# Patient Record
Sex: Male | Born: 2000 | Race: White | Hispanic: No | Marital: Single | Smoking: Never smoker
Health system: Southern US, Community
[De-identification: ages and names within clinical notes are randomized; demographics above are authoritative.]

## PROBLEM LIST (undated history)

## (undated) HISTORY — PX: NO PAST SURGERIES: SHX2092

---
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
## 2022-08-23 ENCOUNTER — Ambulatory Visit
Admission: RE | Admit: 2022-08-23 | Discharge: 2022-08-23 | Disposition: A | Discharge status: Home / Self Care | Payer: BC Managed Care – PPO | Source: Ambulatory Visit | Attending: Emergency Medicine | Admitting: Emergency Medicine

## 2022-08-23 VITALS — BP 120/74 | HR 70 | Temp 98.7°F | Ht 70.0 in | Wt 170.0 lb

## 2022-08-23 DIAGNOSIS — Z20822 Contact with and (suspected) exposure to covid-19: Secondary | ICD-10-CM | POA: Diagnosis not present

## 2022-08-23 DIAGNOSIS — H66001 Acute suppurative otitis media without spontaneous rupture of ear drum, right ear: Secondary | ICD-10-CM | POA: Insufficient documentation

## 2022-08-23 DIAGNOSIS — H6122 Impacted cerumen, left ear: Secondary | ICD-10-CM | POA: Diagnosis not present

## 2022-08-23 LAB — RESP PANEL BY RT-PCR (FLU A&B, COVID) ARPGX2
Influenza A by PCR: NEGATIVE
Influenza B by PCR: NEGATIVE
SARS Coronavirus 2 by RT PCR: NEGATIVE

## 2022-08-23 MED ORDER — FLUTICASONE PROPIONATE 50 MCG/ACT NA SUSP: 2.0000 | Freq: Every day | NASAL | 0 refills | Status: AC

## 2022-08-23 MED ORDER — IBUPROFEN 600 MG PO TABS: 600.0000 mg | ORAL_TABLET | Freq: Four times a day (QID) | ORAL | 0 refills | Status: AC | PRN

## 2022-08-23 MED ORDER — AMOXICILLIN-POT CLAVULANATE 875-125 MG PO TABS: 1.0000 | ORAL_TABLET | Freq: Two times a day (BID) | ORAL | 0 refills | Status: AC

## 2022-08-23 NOTE — ED Provider Notes (Signed)
HPI  SUBJECTIVE:  Henry Warner is a 21 y.o. male who presents with 2 days of right ear pain, right-sided cervical lymphadenopathy, decreased hearing, nasal congestion, rhinorrhea, body aches, cough, and "maybe"  a sore throat.  No fevers, headaches, postnasal drip, loss of sense of smell or taste, wheezing, shortness of breath, nausea, vomiting, diarrhea, abdominal pain.  No otorrhea.  He states that he used a Q-tip to try and clean his ear out.  No known COVID or flu exposure.  He got 3 doses of the COVID-vaccine and this year's flu vaccine.  He is able to sleep at night without waking up coughing.  He has tried Tylenol with improvement in his symptoms.  Last dose was within 6 hours of evaluation.  He has also tried Claritin, DayQuil, NyQuil.  No aggravating factors.  No antibiotics in the past month.  He has a past medical history of COVID in 2022.  PCP: Cannot remember.    History reviewed. No pertinent past medical history.  Past Surgical History:  Procedure Laterality Date   NO PAST SURGERIES      Family History  Problem Relation Age of Onset   Healthy Mother    Diabetes Father     Social History   Tobacco Use   Smoking status: Never   Smokeless tobacco: Never  Vaping Use   Vaping Use: Never used  Substance Use Topics   Alcohol use: No   Drug use: No    No current facility-administered medications for this encounter.  Current Outpatient Medications:    amoxicillin-clavulanate (AUGMENTIN) 875-125 MG tablet, Take 1 tablet by mouth every 12 (twelve) hours for 7 days., Disp: 14 tablet, Rfl: 0   FLUoxetine (PROZAC) 20 MG capsule, Take by mouth., Disp: , Rfl:    fluticasone (FLONASE) 50 MCG/ACT nasal spray, Place 2 sprays into both nostrils daily., Disp: 16 g, Rfl: 0   ibuprofen (ADVIL) 600 MG tablet, Take 1 tablet (600 mg total) by mouth every 6 (six) hours as needed., Disp: 30 tablet, Rfl: 0   omeprazole (PRILOSEC) 20 MG capsule, Take by mouth., Disp: , Rfl:   No Known  Allergies   ROS  As noted in HPI.   Physical Exam  BP 120/74 (BP Location: Left Arm)   Pulse 70   Temp 98.7 F (37.1 C) (Temporal)   Ht 5\' 10"  (1.778 m)   Wt 77.1 kg   SpO2 98%   BMI 24.39 kg/m   Constitutional: Well developed, well nourished, no acute distress Eyes:  EOMI, conjunctiva normal bilaterally HENT: Normocephalic, atraumatic,mucus membranes moist.  Positive nasal congestion.  Erythematous, swollen turbinates.  No maxillary, frontal sinus tenderness.  Left TM obscured with cerumen.  No pain with traction on pinna, palpation of tragus or mastoid right ear.  Abrasion right EAC.  Right TM dull, erythematous, bulging.  Decreased hearing right ear compared to left normal oropharynx.  Positive cobblestoning. Neck: No appreciable anterior, posterior cervical lymphadenopathy, pre or postauricular lymphadenopathy. Respiratory: Normal inspiratory effort lungs clear bilaterally Cardiovascular: Normal rate, regular rhythm, no murmurs, rubs, gallops GI: nondistended skin: No rash, skin intact Musculoskeletal: no deformities Neurologic: Alert & oriented x 3, no focal neuro deficits Psychiatric: Speech and behavior appropriate   ED Course   Medications - No data to display  Orders Placed This Encounter  Procedures   Resp Panel by RT-PCR (Flu A&B, Covid) Anterior Nasal Swab    Standing Status:   Standing    Number of Occurrences:   1  Results for orders placed or performed during the hospital encounter of 08/23/22 (from the past 24 hour(s))  Resp Panel by RT-PCR (Flu A&B, Covid) Anterior Nasal Swab     Status: None   Collection Time: 08/23/22  1:34 PM   Specimen: Anterior Nasal Swab  Result Value Ref Range   SARS Coronavirus 2 by RT PCR NEGATIVE NEGATIVE   Influenza A by PCR NEGATIVE NEGATIVE   Influenza B by PCR NEGATIVE NEGATIVE   No results found.  ED Clinical Impression  1. Non-recurrent acute suppurative otitis media of right ear without spontaneous rupture  of tympanic membrane   2. Impacted cerumen of left ear   3. Encounter for laboratory testing for COVID-19 virus      ED Assessment/Plan    Checking COVID and flu per patient request.  Unfortunately, patient does not qualify for antivirals as he is otherwise healthy and is fully vaccinated.  Will prescribe Tamiflu if influenza is positive.  He appears to have a right otitis media.  Sending home with Augmentin 875 mg p.o. twice daily for 7 days, Flonase, Mucinex D, saline nasal irrigation, Tylenol/ibuprofen.  Return here versus follow-up with PCP in 3 days if not improving.  COVID, influenza negative.  Discussed labs,  MDM, treatment plan, and plan for follow-up with patient.patient agrees with plan.   Meds ordered this encounter  Medications   amoxicillin-clavulanate (AUGMENTIN) 875-125 MG tablet    Sig: Take 1 tablet by mouth every 12 (twelve) hours for 7 days.    Dispense:  14 tablet    Refill:  0   fluticasone (FLONASE) 50 MCG/ACT nasal spray    Sig: Place 2 sprays into both nostrils daily.    Dispense:  16 g    Refill:  0   ibuprofen (ADVIL) 600 MG tablet    Sig: Take 1 tablet (600 mg total) by mouth every 6 (six) hours as needed.    Dispense:  30 tablet    Refill:  0      *This clinic note was created using [employer]. Therefore, there may be occasional mistakes despite careful proofreading.  ?    Melynda Ripple, MD 08/23/22 1505

## 2022-08-23 NOTE — ED Triage Notes (Signed)
Pt c/o RT ear ache, lymph nodes swollen onset x2 days ago

## 2022-08-23 NOTE — Discharge Instructions (Addendum)
Finish the Augmentin, even if you feel better.  Take 600 mg of ibuprofen, 1000 mg of Tylenol 3 times a day as needed for pain.  Flonase, Mucinex D, saline nasal irrigation with a NeilMed sinus rinse and distilled water as often as you want for the nasal congestion.  Discontinue other over-the-counter cold medications.  We will contact you if your COVID or flu, are positive.  I will prescribe Tamiflu if your influenza is positive.

## 2023-05-06 ENCOUNTER — Ambulatory Visit
Admission: RE | Admit: 2023-05-06 | Discharge: 2023-05-06 | Disposition: A | Discharge status: Home / Self Care | Payer: BC Managed Care – PPO | Source: Ambulatory Visit

## 2023-05-06 ENCOUNTER — Other Ambulatory Visit: Payer: Self-pay

## 2023-05-06 ENCOUNTER — Ambulatory Visit (INDEPENDENT_AMBULATORY_CARE_PROVIDER_SITE_OTHER): Payer: BC Managed Care – PPO

## 2023-05-06 VITALS — BP 110/62 | HR 71 | Temp 99.2°F | Resp 18

## 2023-05-06 DIAGNOSIS — M25572 Pain in left ankle and joints of left foot: Secondary | ICD-10-CM

## 2023-05-06 NOTE — Discharge Instructions (Signed)
X-ray today shows there is no injury to the bone, there is a small amount of fluid within the joint space that should reabsorb into the tissue with time  Take ibuprofen 600 to 800 mg every 6-8 hours consistently for the next 3 to 5 days, may take Tylenol in addition if pain is flared, this helps to reduce inflammation and will help to minimize symptoms  Continue to elevate whenever sitting and lying to help reduce swelling and for general comfort  Use ice or heat over the affected area in 10 to 15-minute intervals  A ankle brace has been applied for stability and support, use whenever completing activity, may remove at rest  If you are still having significant pain in 2 weeks please follow-up with your orthopedic specialist or podiatrist, information is listed on front page

## 2023-05-06 NOTE — ED Provider Notes (Signed)
[facility]    CSN: [id number] Arrival date & time: 05/06/23  1253      History   Chief Complaint Chief Complaint  Patient presents with   Ankle Pain    Feels sprained, need xray for possible break. - Entered by patient    HPI Henry Warner is a 22 y.o. male.   Patient presents for evaluation for left ankle pain and swelling beginning 1 day ago after injury.  Endorses that he was playing soccer when foot turned outward quickly causing immediate discomfort, unable to fully bear weight and had to limp off the field.  Symptoms have persisted since however now able to bear weight but feeling pressure, pain felt only when walking.  Able to complete range of motion but pain is felt with all movement.  Denies numbness or tingling.  Has not taking any oral medications.  Has attempted use of ice and elevation.  Has been using crutches for stability.    History reviewed. No pertinent past medical history.  There are no problems to display for this patient.   Past Surgical History:  Procedure Laterality Date   NO PAST SURGERIES         Home Medications    Prior to Admission medications   Medication Sig Start Date End Date Taking? Authorizing Provider  amoxicillin (AMOXIL) 500 MG capsule Take 500 mg by mouth 3 (three) times daily.   Yes [provider]  FLUoxetine (PROZAC) 20 MG capsule Take by mouth. 05/31/22  Yes [provider]  omeprazole (PRILOSEC) 20 MG capsule Take by mouth.   Yes [provider]  fluticasone (FLONASE) 50 MCG/ACT nasal spray Place 2 sprays into both nostrils daily. 08/23/22   Domenick Gong, MD  ibuprofen (ADVIL) 600 MG tablet Take 1 tablet (600 mg total) by mouth every 6 (six) hours as needed. 08/23/22   Domenick Gong, MD    Family History Family History  Problem Relation Age of Onset   Healthy Mother    Diabetes Father     Social History Social History   Tobacco Use   Smoking status: Never    Smokeless tobacco: Never  Vaping Use   Vaping status: Never Used  Substance Use Topics   Alcohol use: No   Drug use: No     Allergies   Patient has no known allergies.   Review of Systems Review of Systems   Physical Exam Triage Vital Signs ED Triage Vitals  Encounter Vitals Group     BP 05/06/23 1306 110/62     Systolic BP Percentile --      Diastolic BP Percentile --      Pulse Rate 05/06/23 1306 71     Resp 05/06/23 1306 18     Temp 05/06/23 1306 99.2 F (37.3 C)     Temp Source 05/06/23 1306 Oral     SpO2 05/06/23 1306 96 %     Weight --      Height --      Head Circumference --      Peak Flow --      Pain Score 05/06/23 1303 2     Pain Loc --      Pain Education --      Exclude from Growth Chart --    No data found.  Updated Vital Signs BP 110/62   Pulse 71   Temp 99.2 F (37.3 C) (Oral)   Resp 18   SpO2 96%   Visual Acuity Right  Eye Distance:   Left Eye Distance:   Bilateral Distance:    Right Eye Near:   Left Eye Near:    Bilateral Near:     Physical Exam Constitutional:      Appearance: Normal appearance.  Eyes:     Extraocular Movements: Extraocular movements intact.  Pulmonary:     Effort: Pulmonary effort is normal.  Musculoskeletal:     Comments: Tenderness is to the medial and lateral aspect of the left ankle with mild to moderate swelling along the lateral aspect, able to bear weight, able to complete range of motion but pain is elicited with flexion, extension and rotation, 2+ dorsalis pedis pulse, sensation intact  Neurological:     Mental Status: He is alert and oriented to person, place, and time. Mental status is at baseline.      UC Treatments / Results  Labs (all labs ordered are listed, but only abnormal results are displayed) Labs Reviewed - No data to display  EKG   Radiology DG Ankle Complete Left  Result Date: 05/06/2023 CLINICAL DATA:  Injured left ankle playing soccer, now with lateral sided ankle pain.  EXAM: LEFT ANKLE COMPLETE - 3+ VIEW COMPARISON:  None Available. FINDINGS: No fracture or dislocation. Joint spaces are preserved. The ankle mortise is preserved. Suspected trace ankle joint effusion. No plantar calcaneal spur. Regional soft tissues appear normal. No radiopaque foreign body. IMPRESSION: Suspected trace ankle joint effusion without associated fracture or dislocation. Electronically Signed   By: Simonne Come M.D.   On: 05/06/2023 14:03    Procedures Procedures (including critical care time)  Medications Ordered in UC Medications - No data to display  Initial Impression / Assessment and Plan / UC Course  I have reviewed the triage vital signs and the nursing notes.  Pertinent labs & imaging results that were available during my care of the patient were reviewed by me and considered in my medical decision making (see chart for details).  Acute left ankle pain  X-ray negative for injury to the bone, small effusion noted, discussed with patient and parent, ankle brace applied to be used as needed, recommended over-the-counter ibuprofen to be used consistently for management, recommended RICE, heat massage stretching and activity as tolerated, advise follow-up with orthopedics or podiatry in 2 weeks if symptoms still present Final Clinical Impressions(s) / UC Diagnoses   Final diagnoses:  None   Discharge Instructions   None    ED Prescriptions   None    PDMP not reviewed this encounter.   Valinda Hoar, NP 05/06/23 1458

## 2023-05-06 NOTE — ED Triage Notes (Signed)
Was playing soccer last night and rolled left foot and woke up with pain and swelling of left ankle
